# Patient Record
Sex: Female | Born: 1995 | Race: White | Hispanic: Yes | Marital: Single | Smoking: Never smoker
Health system: Southern US, Community
[De-identification: ages and names within clinical notes are randomized; demographics above are authoritative.]

## PROBLEM LIST (undated history)

## (undated) DIAGNOSIS — J45909 Unspecified asthma, uncomplicated: Secondary | ICD-10-CM

## (undated) DIAGNOSIS — F419 Anxiety disorder, unspecified: Secondary | ICD-10-CM

---
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
## 2015-07-31 IMAGING — CR DG FOREARM 2V*R*
2 series · 2 of 2 positions shown · non-contrast
Comparison: None.

CLINICAL DATA: Mid and distal forearm pain while playing
volleyball.

EXAM:
RIGHT FOREARM - 2 VIEW

[x forearm ap right]
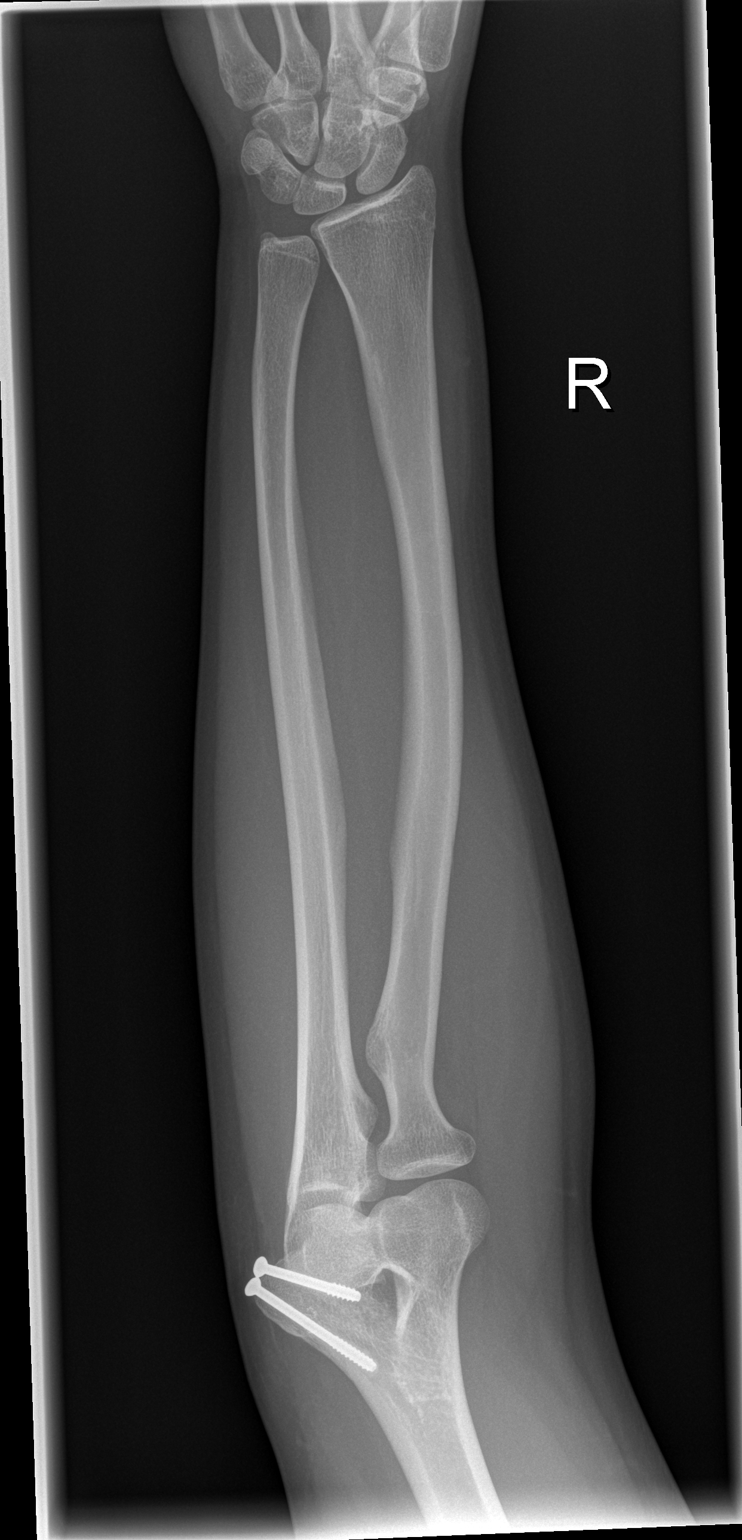

[x forearm lat right]
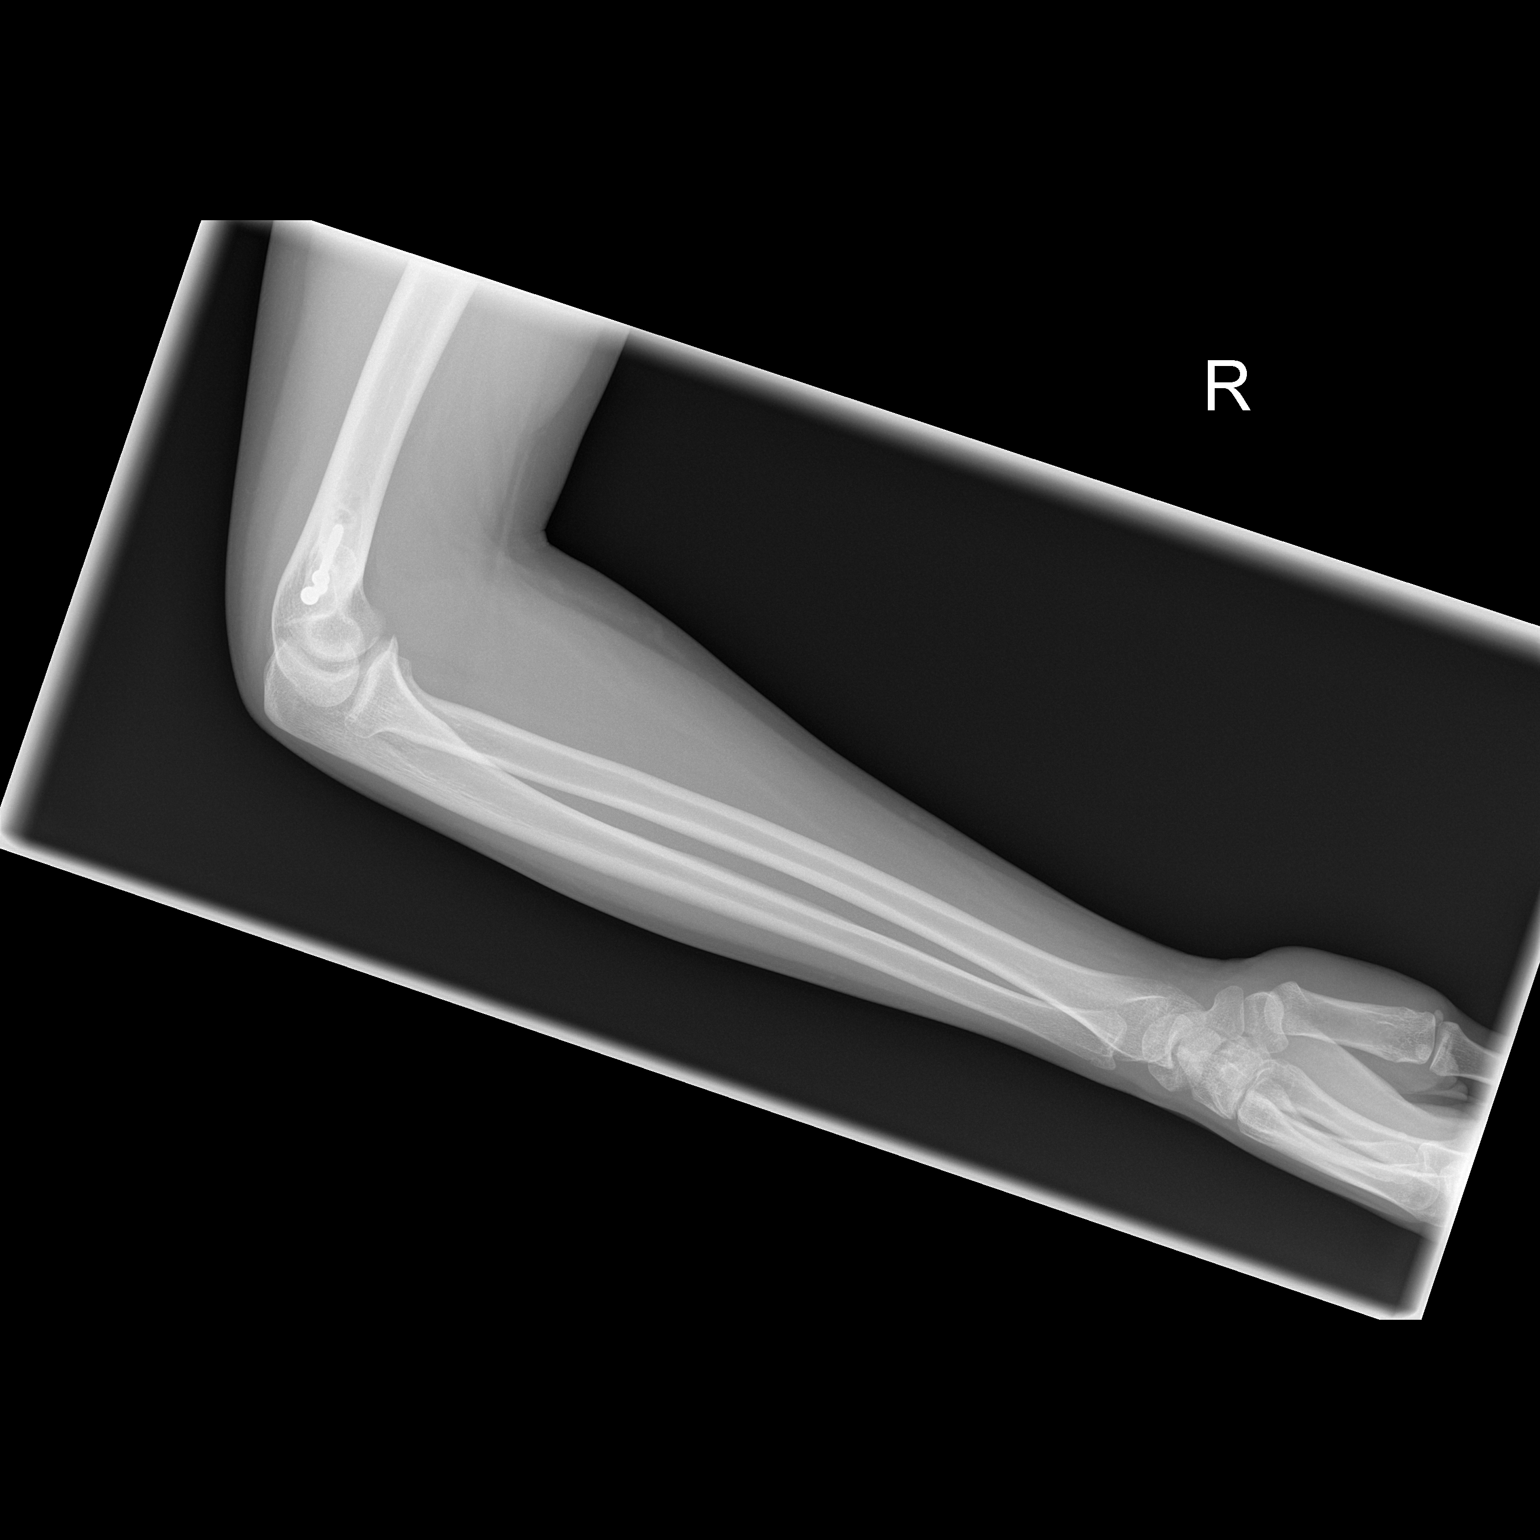

[2 of 2 positions shown; findings below may reference images not displayed]

FINDINGS: There is no acute fracture or dislocation. There are 2 cannulated
screws transfixing the medial upper condyle. The soft tissues are
normal.
IMPRESSION: No acute osseous injury of the right forearm.
